# Patient Record
Sex: Male | Born: 1990 | Race: White | Hispanic: No | Marital: Single | Smoking: Current every day smoker
Health system: Southern US, Community
[De-identification: ages and names within clinical notes are randomized; demographics above are authoritative.]

---

## 2013-01-31 ENCOUNTER — Emergency Department: Payer: Self-pay | Admitting: Emergency Medicine

## 2014-07-18 ENCOUNTER — Emergency Department: Payer: Self-pay | Admitting: Internal Medicine

## 2014-09-14 ENCOUNTER — Emergency Department: Payer: Self-pay | Admitting: Emergency Medicine

## 2016-05-18 ENCOUNTER — Emergency Department
Admission: EM | Admit: 2016-05-18 | Discharge: 2016-05-18 | Disposition: A | Discharge status: Home / Self Care | Payer: Self-pay | Attending: Student | Admitting: Student

## 2016-05-18 DIAGNOSIS — K047 Periapical abscess without sinus: Secondary | ICD-10-CM | POA: Insufficient documentation

## 2016-05-18 MED ORDER — IBUPROFEN 800 MG PO TABS
800.0000 mg | ORAL_TABLET | Freq: Three times a day (TID) | ORAL | Status: DC | PRN
Start: 2016-05-18 — End: 2020-04-06

## 2016-05-18 MED ORDER — NAPROXEN 500 MG PO TABS: 500.0000 mg | ORAL_TABLET | Freq: Two times a day (BID) | ORAL | Status: DC

## 2016-05-18 MED ORDER — SULFAMETHOXAZOLE-TRIMETHOPRIM 800-160 MG PO TABS: 1.0000 | ORAL_TABLET | Freq: Two times a day (BID) | ORAL | Status: DC

## 2016-05-18 MED ORDER — OXYCODONE-ACETAMINOPHEN 5-325 MG PO TABS: 1.0000 | ORAL_TABLET | ORAL | Status: DC | PRN

## 2016-05-18 MED ORDER — LIDOCAINE VISCOUS 2 % MT SOLN: 20.0000 mL | OROMUCOSAL | Status: DC | PRN

## 2016-05-18 NOTE — Discharge Instructions (Signed)

## 2016-05-18 NOTE — ED Provider Notes (Signed)
[facility] Emergency Department Provider Note  ____________________________________________  Time seen: Approximately 2:23 PM  I have reviewed the triage vital signs and the nursing notes.   HISTORY  Chief Complaint Dental Pain    HPI Frank Graham is a 25 y.o. male presents for evaluation of right lower dental pain since yesterday. Patient reports waking up with some facial swelling this morning. Patient reports the pain is 10 over 10 and is not taking any medications at this time.   No past medical history on file.  There are no active problems to display for this patient.   No past surgical history on file.  Current Outpatient Rx  Name  Route  Sig  Dispense  Refill  . ibuprofen (ADVIL,MOTRIN) 800 MG tablet   Oral   Take 1 tablet (800 mg total) by mouth every 8 (eight) hours as needed.   30 tablet   0   . lidocaine (XYLOCAINE) 2 % solution   Mouth/Throat   Use as directed 20 mLs in the mouth or throat as needed for mouth pain.   100 mL   0   . naproxen (NAPROSYN) 500 MG tablet   Oral   Take 1 tablet (500 mg total) by mouth 2 (two) times daily with a meal.   60 tablet   0   . oxyCODONE-acetaminophen (ROXICET) 5-325 MG tablet   Oral   Take 1-2 tablets by mouth every 4 (four) hours as needed for severe pain.   15 tablet   0   . sulfamethoxazole-trimethoprim (BACTRIM DS,SEPTRA DS) 800-160 MG tablet   Oral   Take 1 tablet by mouth 2 (two) times daily.   20 tablet   0     Allergies Review of patient's allergies indicates not on file.  No family history on file.  Social History Social History  Substance Use Topics  . Smoking status: Not on file  . Smokeless tobacco: Not on file  . Alcohol Use: Not on file    Review of Systems Constitutional: No fever/chills Eyes: No visual changes. ENT: No sore throat .Right face obviously swollen and tender. Cardiovascular: Denies chest pain. Respiratory: Denies shortness of  breath. Gastrointestinal: No abdominal pain.  No nausea, no vomiting.  No diarrhea.  No constipation. Genitourinary: Negative for dysuria. Musculoskeletal: Negative for back pain. Skin: Negative for rash. Neurological: Negative for headaches, focal weakness or numbness.  10-point ROS otherwise negative.  ____________________________________________   PHYSICAL EXAM:  VITAL SIGNS: ED Triage Vitals  Enc Vitals Group     BP 05/18/16 1418 125/77 mmHg     Pulse Rate 05/18/16 1418 80     Resp 05/18/16 1418 16     Temp 05/18/16 1418 97.5 F (36.4 C)     Temp Source 05/18/16 1418 Oral     SpO2 05/18/16 1418 99 %     Weight 05/18/16 1418 145 lb (65.772 kg)     Height 05/18/16 1418 5\' 8"  (1.727 m)     Head Cir --      Peak Flow --      Pain Score --      Pain Loc --      Pain Edu? --      Excl. in GC? --     Constitutional: Alert and oriented. Well appearing and in no acute distress. Head: Atraumatic. Nose: No congestion/rhinnorhea. Mouth/Throat: Mucous membranes are moist.  Oropharynx non-erythematous. Obvious dental caries in the right lower side facial swelling noted. Neck: No stridor.  Mild right cervical adenopathy. Cardiovascular: Normal rate, regular rhythm. Grossly normal heart sounds.  Good peripheral circulation. Respiratory: Normal respiratory effort.  No retractions. Lungs CTAB. Musculoskeletal: No lower extremity tenderness nor edema.  No joint effusions. Neurologic:  Normal speech and language. No gross focal neurologic deficits are appreciated. No gait instability. Skin:  Skin is warm, dry and intact. No rash noted. Psychiatric: Mood and affect are normal. Speech and behavior are normal.  ____________________________________________   LABS (all labs ordered are listed, but only abnormal results are displayed)  Labs Reviewed - No data to display   PROCEDURES  Procedure(s) performed: None  Critical Care performed:  No  ____________________________________________   INITIAL IMPRESSION / ASSESSMENT AND PLAN / ED COURSE  Pertinent labs & imaging results that were available during my care of the patient were reviewed by me and considered in my medical decision making (see chart for details).  Acute dental abscess. Patient was started on prescription for Septra DS twice a day, Percocet 5/325, Naprosyn 500 mg, viscous lidocaine as needed. Patient follow-up with local dentist as soon as possible. ____________________________________________   FINAL CLINICAL IMPRESSION(S) / ED DIAGNOSES  Final diagnoses:  Abscess, dental     This chart was dictated using voice recognition software/Dragon. Despite best efforts to proofread, errors can occur which can change the meaning. Any change was purely unintentional.   Evangeline Dakinharles M Atley Neubert, PA-C 05/18/16 1513  Frank DossEryka A Gayle, MD 05/18/16 [phone]

## 2016-05-18 NOTE — ED Notes (Signed)
Pt c/o right lower tooth pain since yesterday and today woke with facial swelling.

## 2016-11-21 ENCOUNTER — Emergency Department: Payer: Self-pay

## 2016-11-21 ENCOUNTER — Encounter: Payer: Self-pay | Admitting: Emergency Medicine

## 2016-11-21 ENCOUNTER — Emergency Department
Admission: EM | Admit: 2016-11-21 | Discharge: 2016-11-21 | Disposition: A | Discharge status: Home / Self Care | Payer: Self-pay | Attending: Emergency Medicine | Admitting: Emergency Medicine

## 2016-11-21 DIAGNOSIS — S62639B Displaced fracture of distal phalanx of unspecified finger, initial encounter for open fracture: Secondary | ICD-10-CM

## 2016-11-21 DIAGNOSIS — W230XXA Caught, crushed, jammed, or pinched between moving objects, initial encounter: Secondary | ICD-10-CM | POA: Insufficient documentation

## 2016-11-21 DIAGNOSIS — Y929 Unspecified place or not applicable: Secondary | ICD-10-CM | POA: Insufficient documentation

## 2016-11-21 DIAGNOSIS — Y990 Civilian activity done for income or pay: Secondary | ICD-10-CM | POA: Insufficient documentation

## 2016-11-21 DIAGNOSIS — F17210 Nicotine dependence, cigarettes, uncomplicated: Secondary | ICD-10-CM | POA: Insufficient documentation

## 2016-11-21 DIAGNOSIS — S67190A Crushing injury of right index finger, initial encounter: Secondary | ICD-10-CM | POA: Insufficient documentation

## 2016-11-21 DIAGNOSIS — Z23 Encounter for immunization: Secondary | ICD-10-CM | POA: Insufficient documentation

## 2016-11-21 DIAGNOSIS — S6710XA Crushing injury of unspecified finger(s), initial encounter: Secondary | ICD-10-CM

## 2016-11-21 DIAGNOSIS — Y9389 Activity, other specified: Secondary | ICD-10-CM | POA: Insufficient documentation

## 2016-11-21 DIAGNOSIS — Z791 Long term (current) use of non-steroidal anti-inflammatories (NSAID): Secondary | ICD-10-CM | POA: Insufficient documentation

## 2016-11-21 DIAGNOSIS — S62632A Displaced fracture of distal phalanx of right middle finger, initial encounter for closed fracture: Secondary | ICD-10-CM | POA: Insufficient documentation

## 2016-11-21 MED ORDER — HYDROCODONE-ACETAMINOPHEN 5-325 MG PO TABS: 1.0000 | ORAL_TABLET | Freq: Three times a day (TID) | ORAL | 0 refills | Status: DC | PRN

## 2016-11-21 MED ORDER — HYDROCODONE-ACETAMINOPHEN 5-325 MG PO TABS
1.0000 | ORAL_TABLET | Freq: Once | ORAL | Status: AC
  Administered 2016-11-21: 1 via ORAL
  Filled 2016-11-21: qty 1

## 2016-11-21 MED ORDER — TETANUS-DIPHTH-ACELL PERTUSSIS 5-2.5-18.5 LF-MCG/0.5 IM SUSP
0.5000 mL | Freq: Once | INTRAMUSCULAR | Status: AC
  Administered 2016-11-21: 0.5 mL via INTRAMUSCULAR
  Filled 2016-11-21: qty 0.5

## 2016-11-21 MED ORDER — LIDOCAINE HCL (PF) 1 % IJ SOLN
5.0000 mL | Freq: Once | INTRAMUSCULAR | Status: AC
  Administered 2016-11-21: 5 mL
  Filled 2016-11-21: qty 5

## 2016-11-21 MED ORDER — SULFAMETHOXAZOLE-TRIMETHOPRIM 800-160 MG PO TABS: 1.0000 | ORAL_TABLET | Freq: Two times a day (BID) | ORAL | 0 refills | Status: DC

## 2016-11-21 MED ORDER — SULFAMETHOXAZOLE-TRIMETHOPRIM 800-160 MG PO TABS
1.0000 | ORAL_TABLET | Freq: Once | ORAL | Status: AC
  Administered 2016-11-21: 1 via ORAL
  Filled 2016-11-21: qty 1

## 2016-11-21 NOTE — ED Notes (Signed)
Provider requested patient to soak hand in sterile saline. Sterile saline brought to room - patient currently soaking hand.  Pt unsure of date of last tetanus shot.

## 2016-11-21 NOTE — ED Triage Notes (Addendum)
Patient presents to the ED with forefinger injury to right hand.  Patient states his finger got smashed between two 800lb logs.  Patient states, "The end of my finger exploded like a paintball."  Patient is also complaining of pain to his middle finger to his right hand.  Patient appears uncomfortable in triage.  Patient states he does not want to file worker's comp.

## 2016-11-21 NOTE — Discharge Instructions (Signed)
Keep the wounds clean, dry, and covered. Change the dressing daily. Wear the splint as needed for support and protection. Follow-up with Dr. Rosita KeaMenz for further fracture care.

## 2016-11-22 NOTE — ED Provider Notes (Signed)
[facility] Emergency Department Provider Note ____________________________________________  Time seen: 34  I have reviewed the triage vital signs and the nursing notes.  HISTORY  Chief Complaint  Finger Injury  HPI Frank Graham is a 26 y.o. male visits to the ED, for evaluation of injury to his index finger and middle fingers right hand. 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 He describes essentially a crush injury to his index finger. He has also some swelling to the tip of his middle finger, without any laceration. He notes blood under the nail of both fingers, and a laceration to the distal portion of his index finger. He is unclear of his current tetanus status.  History reviewed. No pertinent past medical history.  There are no active problems to display for this patient.  History reviewed. No pertinent surgical history.  Prior to Admission medications   Medication Sig Start Date End Date Taking? Authorizing Provider  HYDROcodone-acetaminophen (NORCO) 5-325 MG tablet Take 1 tablet by mouth 3 (three) times daily as needed. 11/21/16   Clinten Howk V Bacon Larson Limones, PA-C  ibuprofen (ADVIL,MOTRIN) 800 MG tablet Take 1 tablet (800 mg total) by mouth every 8 (eight) hours as needed. 05/18/16   Charmayne Sheer Beers, PA-C  lidocaine (XYLOCAINE) 2 % solution Use as directed 20 mLs in the mouth or throat as needed for mouth pain. 05/18/16   Evangeline Dakin, PA-C  naproxen (NAPROSYN) 500 MG tablet Take 1 tablet (500 mg total) by mouth 2 (two) times daily with a meal. 05/18/16   Evangeline Dakin, PA-C  oxyCODONE-acetaminophen (ROXICET) 5-325 MG tablet Take 1-2 tablets by mouth every 4 (four) hours as needed for severe pain. 05/18/16   Charmayne Sheer Beers, PA-C  sulfamethoxazole-trimethoprim (BACTRIM DS,SEPTRA DS) 800-160 MG tablet Take 1 tablet by mouth 2 (two) times daily. 11/21/16   Mikell Camp V Bacon Saniyya Gau, PA-C     Allergies Tramadol  No family history on file.  Social History Social History  Substance Use Topics  . Smoking status: Current Every Day Smoker    Packs/day: 0.75    Types: Cigarettes  . Smokeless tobacco: Never Used  . Alcohol use Yes     Comment: occasionally    Review of Systems  Constitutional: Negative for fever. Cardiovascular: Negative for chest pain. Respiratory: Negative for shortness of breath. Gastrointestinal: Negative for abdominal pain, vomiting and diarrhea. Musculoskeletal: Negative for back pain. Right index and middle finger pain as well. Skin: Negative for rash. Neurological: Negative for headaches, focal weakness or numbness. ____________________________________________  PHYSICAL EXAM:  VITAL SIGNS: ED Triage Vitals  Enc Vitals Group     BP 11/21/16 1834 (!) 146/81     Pulse Rate 11/21/16 1834 85     Resp 11/21/16 1834 18     Temp 11/21/16 1834 98.3 F (36.8 C)     Temp Source 11/21/16 1834 Oral     SpO2 11/21/16 1834 100 %     Weight 11/21/16 1835 150 lb (68 kg)     Height 11/21/16 1835 5\' 8"  (1.727 m)     Head Circumference --      Peak Flow --      Pain Score 11/21/16 1835 9     Pain Loc --      Pain Edu? --      Excl. in GC? --    Constitutional: Alert and oriented. Well appearing and in no  distress. Head: Normocephalic and atraumatic. Cardiovascular: Normal rate, regular rhythm. Normal distal pulses. Respiratory: Normal respiratory effort. No wheezes/rales/rhonchi. Musculoskeletal: Patient's right index finger with an obvious partial degloving mechanism of the skin of the distal phalanx. There is a moderate to full subungual hematoma injury to the otherwise intact nail, as the laceration extends from beyond the nail root. The middle finger shows a 20% subungual hematoma without an laceration or open fracture. Nontender with normal range of motion in all extremities.  Neurologic:  Normal gross sensation. Normal speech and language. No  gross focal neurologic deficits are appreciated. Skin:  Skin is warm, dry and intact. No rash noted. ____________________________________________   RADIOLOGY  Right Hand  IMPRESSION: Minor fracture of the distal tuft of the middle finger.  More extensive injury of the distal index finger. Pronounced soft tissue injury. Comminuted fracture of the distal phalanx as outlined above. No visible extension to the articular surface.  I, Chrystian Cupples, Charlesetta IvoryJenise V Bacon, personally viewed and evaluated these images (plain radiographs) as part of my medical decision making, as well as reviewing the written report by the radiologist. ____________________________________________  PROCEDURES  Norco 5-325 mg i PO Bactrim DS i PO Tdap 0.5 mg IM Dressing  NERVE BLOCK Performed by: Lissa HoardMenshew, Dontre Laduca V Bacon Consent: Verbal consent obtained. Required items: required blood products, implants, devices, and special equipment available Time out: Immediately prior to procedure a "time out" was called to verify the correct patient, procedure, equipment, support staff and site/side marked as required.  Indication: pain relief Nerve block body site: flexor transthecal tendon 2nd & 3rd digits  Preparation: Patient was prepped and draped in the usual sterile fashion. Needle gauge: 24 G Location technique: anatomical landmarks  Local anesthetic: 1% lido  Anesthetic total: 5 ml  Outcome: pain improved Patient tolerance: Patient tolerated the procedure well with no immediate complications.  SPLINT APPLICATION Date/Time: 19:50 Authorized by: Lissa HoardMenshew, Beatrice Sehgal V Bacon Consent: Verbal consent obtained. Risks and benefits: risks, benefits and alternatives were discussed Consent given by: patient Splint applied by: Dorris CarnesJ. Shenee Wignall, PA-C Location details: Right  Splint type: Fold-over Finger Splint Supplies used: ortho-glass Post-procedure: The splinted body part was neurovascularly unchanged following the  procedure. Patient tolerance: Patient tolerated the procedure well with no immediate complications. ____________________________________________  INITIAL IMPRESSION / ASSESSMENT AND PLAN / ED COURSE  Patient with a crush injury to the right index finger with an open distal tuft fracture, and a closed distal tuft fracture of the third digit. Patient is appropriately dressed and splinted and provided with prescriptions for Bactrim and hydrocodone. He will follow up with orthopedics in 2 days for wound check. Return precautions are reviewed. He may return to work with limited use of the right hand.  Clinical Course    ____________________________________________  FINAL CLINICAL IMPRESSION(S) / ED DIAGNOSES  Final diagnoses:  Open fracture of tuft of distal phalanx of finger  Crushing injury of finger, initial encounter      Lissa HoardJenise V Bacon Bryley Chrisman, PA-C 11/22/16 0128    Lissa HoardJenise V Bacon Estefanny Moler, PA-C 11/22/16 0128    Phineas SemenGraydon Goodman, MD 11/22/16 1609

## 2017-08-18 IMAGING — CR DG HAND COMPLETE 3+V*R*
3 series · 3 of 3 positions shown · non-contrast
Comparison: None.

CLINICAL DATA: Crush injury between 2 logs.

EXAM:
RIGHT HAND - COMPLETE 3+ VIEW

[hand ap]
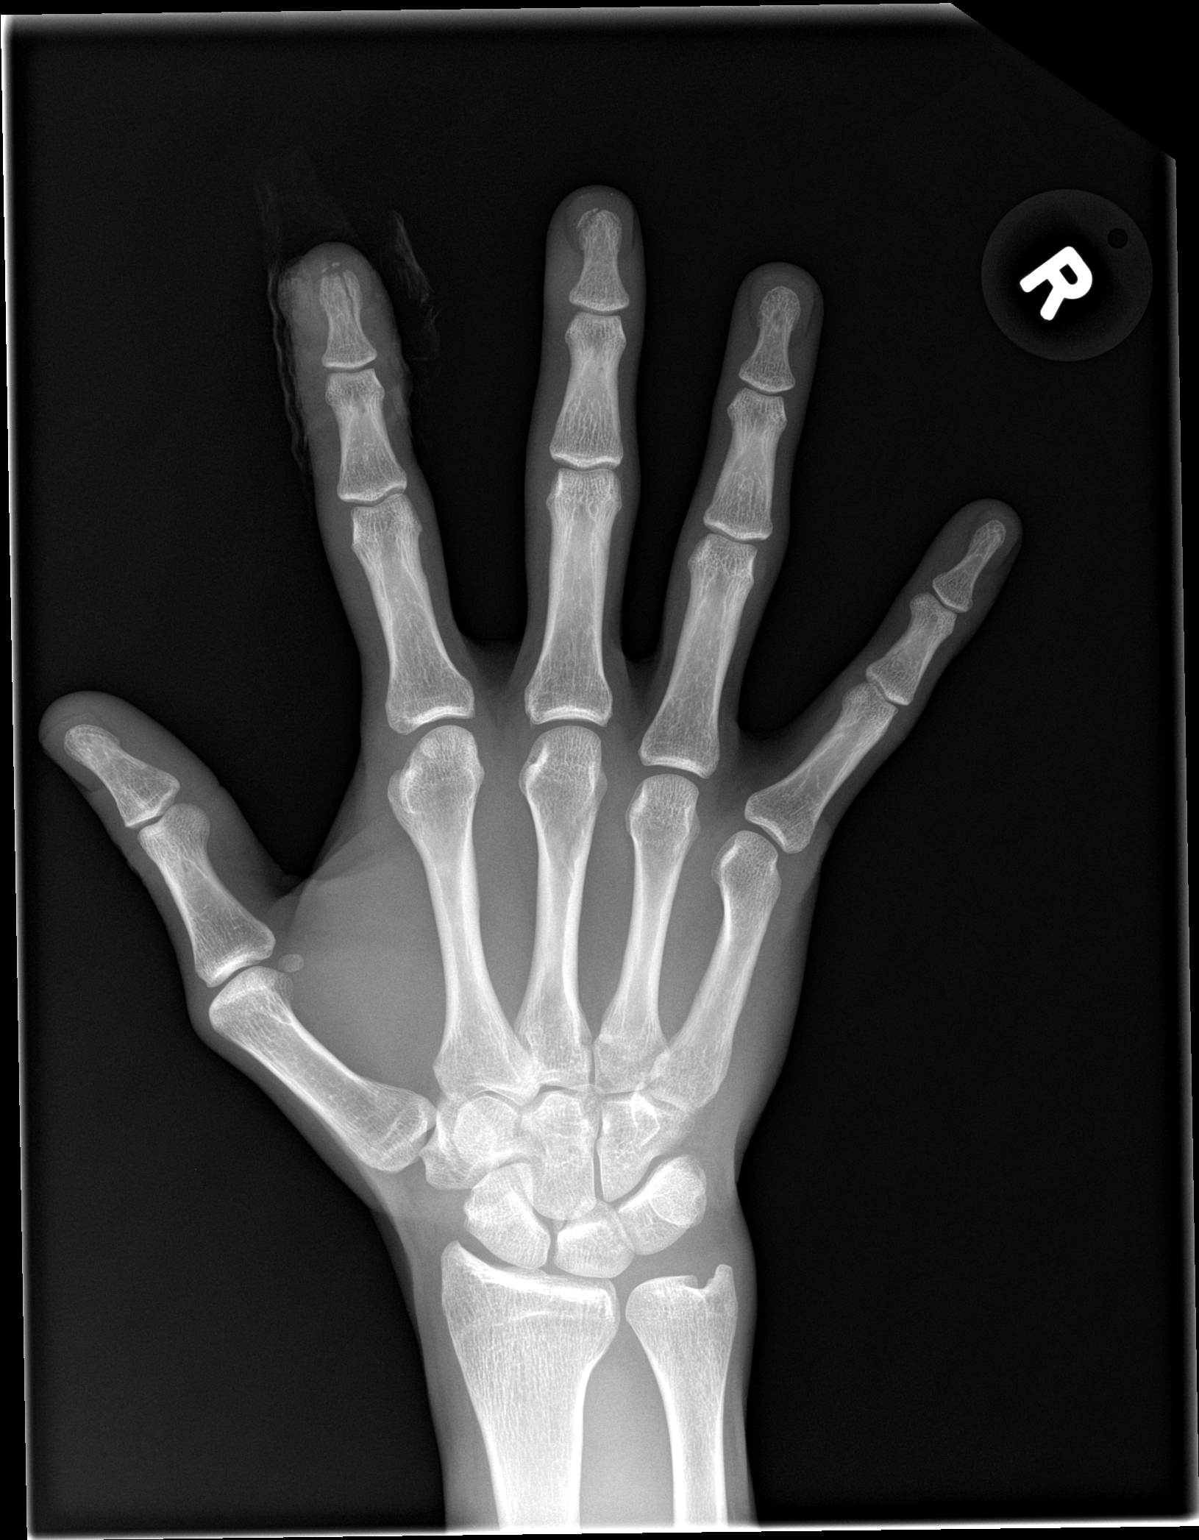

[hand obl]
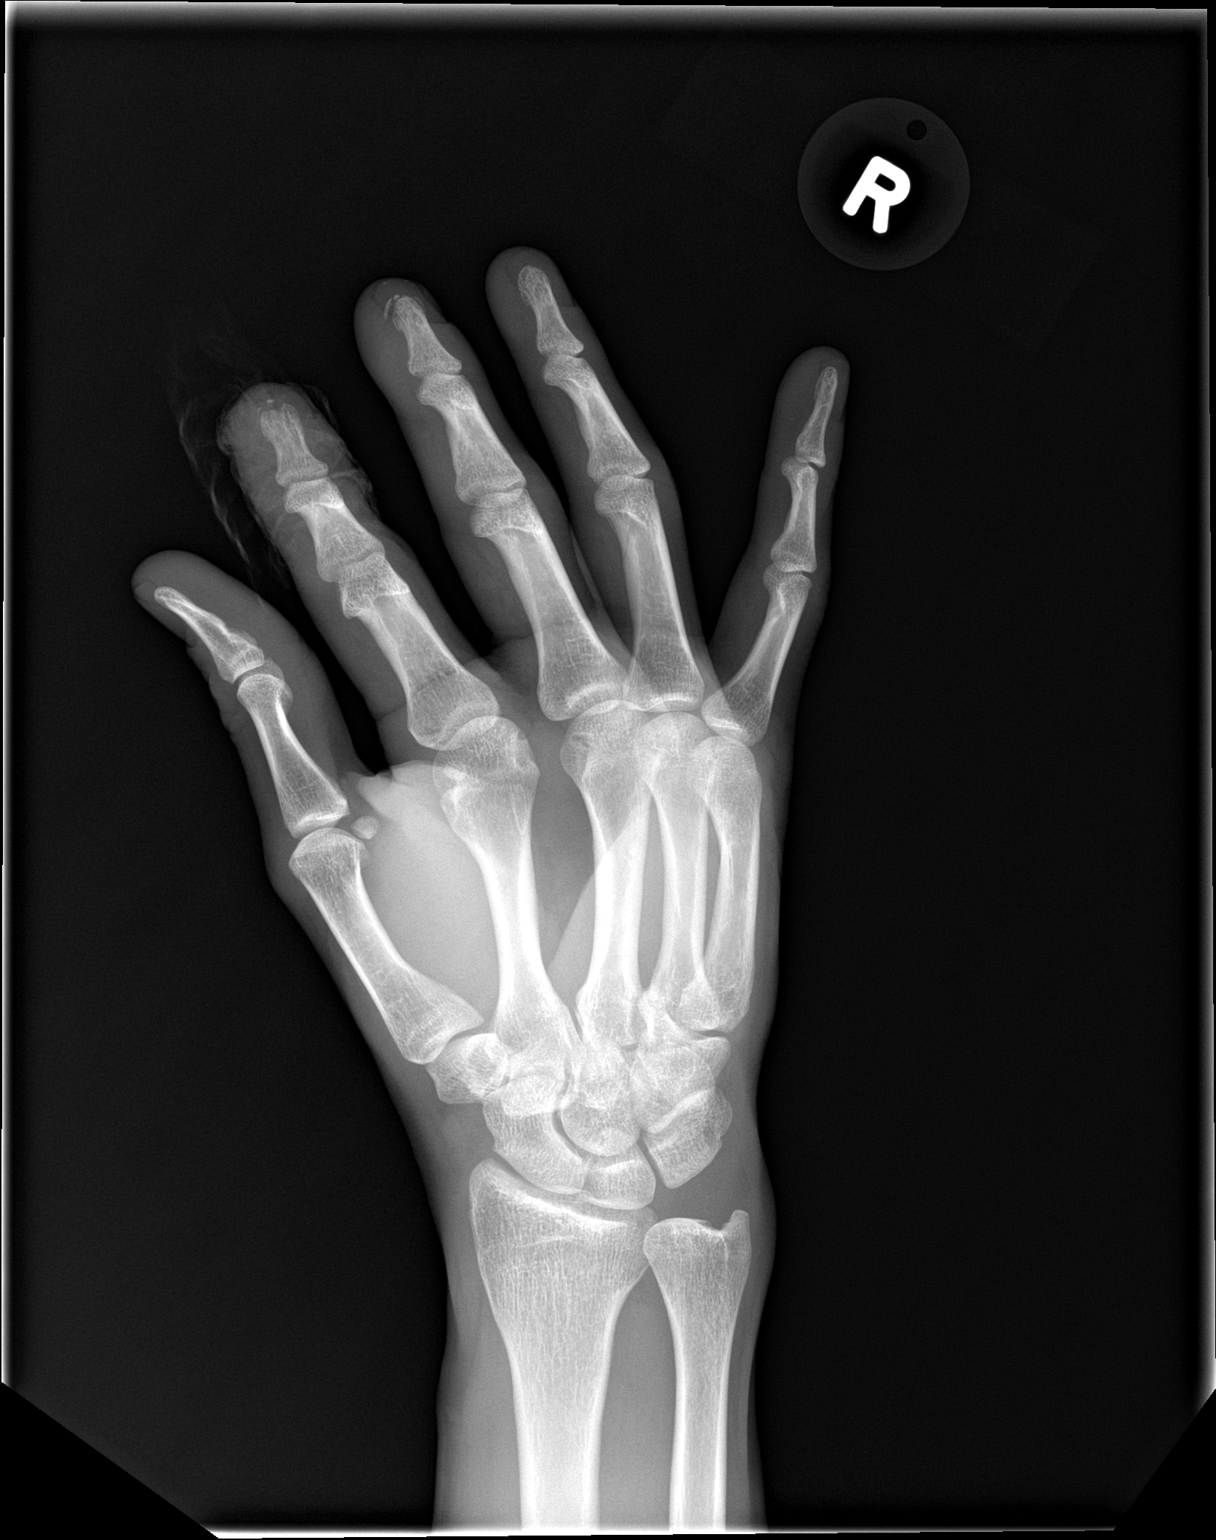

[hand lat]
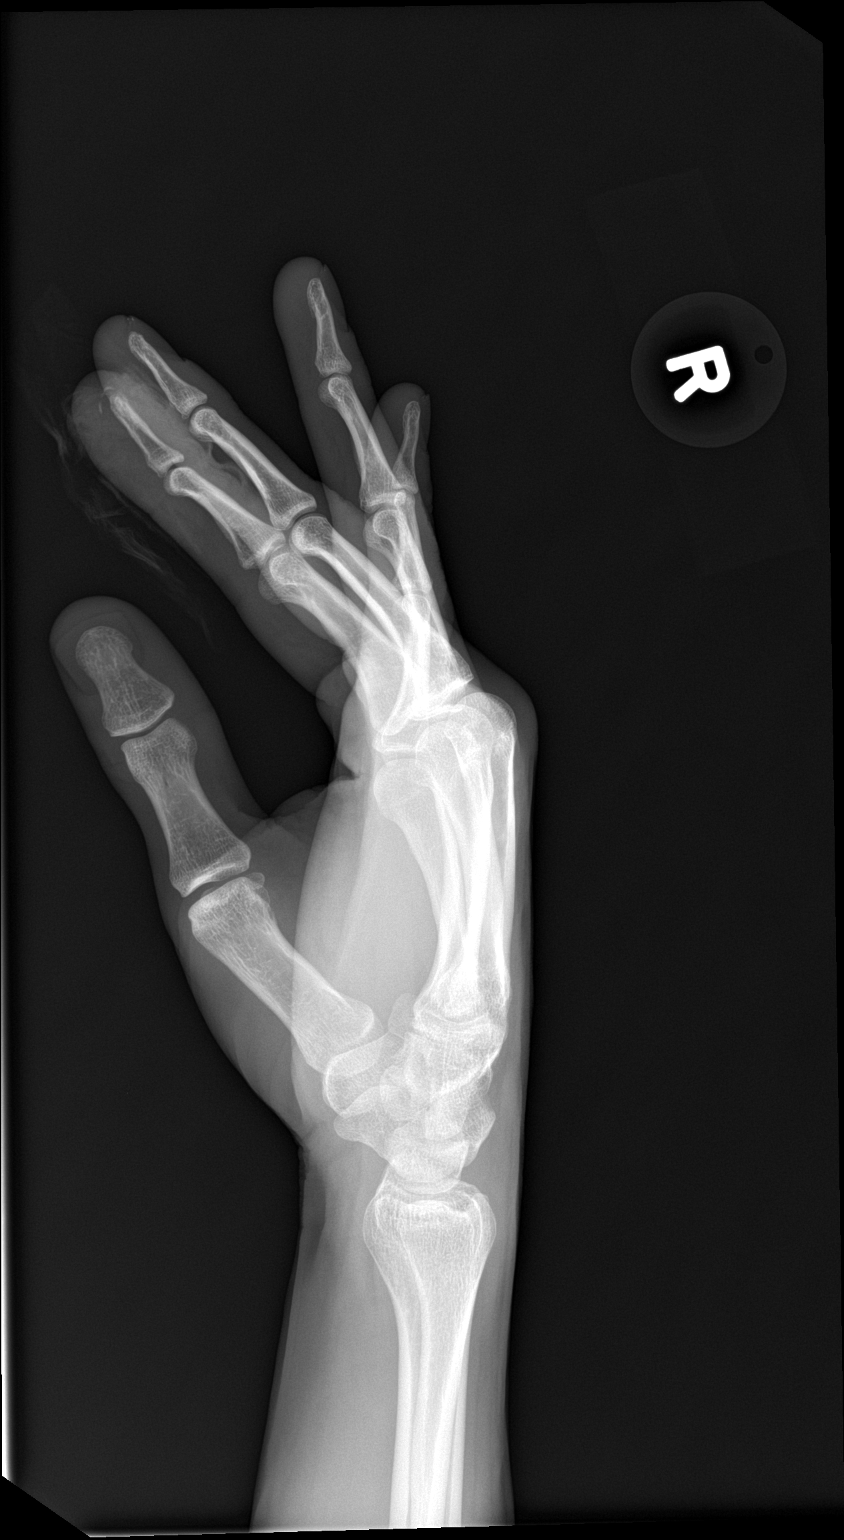

[3 of 3 positions shown; findings below may reference images not displayed]

FINDINGS: Soft tissue deformity of the distal index finger. Fracture of the
distal phalanx with comminution of the distal tuft and a sagittal
fracture line extending towards the proximal bone but not apparently
to the proximal articular surface. Fracture of the distal tuft of
the middle finger. Other bones appear normal.
IMPRESSION: Minor fracture of the distal tuft of the middle finger.

More extensive injury of the distal index finger. Pronounced soft
tissue injury. Comminuted fracture of the distal phalanx as outlined
above. No visible extension to the articular surface.

## 2020-04-06 ENCOUNTER — Other Ambulatory Visit: Payer: Self-pay

## 2020-04-06 ENCOUNTER — Emergency Department
Admission: EM | Admit: 2020-04-06 | Discharge: 2020-04-06 | Disposition: A | Discharge status: Home / Self Care | Payer: Self-pay | Attending: Student | Admitting: Student

## 2020-04-06 DIAGNOSIS — Z7289 Other problems related to lifestyle: Secondary | ICD-10-CM | POA: Insufficient documentation

## 2020-04-06 DIAGNOSIS — R45851 Suicidal ideations: Secondary | ICD-10-CM | POA: Insufficient documentation

## 2020-04-06 DIAGNOSIS — Z23 Encounter for immunization: Secondary | ICD-10-CM | POA: Insufficient documentation

## 2020-04-06 DIAGNOSIS — R4589 Other symptoms and signs involving emotional state: Secondary | ICD-10-CM

## 2020-04-06 DIAGNOSIS — F439 Reaction to severe stress, unspecified: Secondary | ICD-10-CM | POA: Insufficient documentation

## 2020-04-06 DIAGNOSIS — Z046 Encounter for general psychiatric examination, requested by authority: Secondary | ICD-10-CM | POA: Insufficient documentation

## 2020-04-06 DIAGNOSIS — Z79899 Other long term (current) drug therapy: Secondary | ICD-10-CM | POA: Insufficient documentation

## 2020-04-06 DIAGNOSIS — F4323 Adjustment disorder with mixed anxiety and depressed mood: Secondary | ICD-10-CM | POA: Insufficient documentation

## 2020-04-06 DIAGNOSIS — F17210 Nicotine dependence, cigarettes, uncomplicated: Secondary | ICD-10-CM | POA: Insufficient documentation

## 2020-04-06 LAB — CBC
HCT: 40.5 % (ref 39.0–52.0)
Hemoglobin: 14.4 g/dL (ref 13.0–17.0)
MCH: 33.6 pg (ref 26.0–34.0)
MCHC: 35.6 g/dL (ref 30.0–36.0)
MCV: 94.4 fL (ref 80.0–100.0)
Platelets: 189 10*3/uL (ref 150–400)
RBC: 4.29 MIL/uL (ref 4.22–5.81)
RDW: 12.8 % (ref 11.5–15.5)
WBC: 8.5 10*3/uL (ref 4.0–10.5)
nRBC: 0 % (ref 0.0–0.2)

## 2020-04-06 LAB — COMPREHENSIVE METABOLIC PANEL
ALT: 19 U/L (ref 0–44)
AST: 30 U/L (ref 15–41)
Albumin: 5.1 g/dL — ABNORMAL HIGH (ref 3.5–5.0)
Alkaline Phosphatase: 66 U/L (ref 38–126)
Anion gap: 13 (ref 5–15)
BUN: 10 mg/dL (ref 6–20)
CO2: 27 mmol/L (ref 22–32)
Calcium: 9.5 mg/dL (ref 8.9–10.3)
Chloride: 102 mmol/L (ref 98–111)
Creatinine, Ser: 0.81 mg/dL (ref 0.61–1.24)
GFR calc Af Amer: >60 mL/min (ref >60)
GFR calc non Af Amer: >60 mL/min (ref >60)
Glucose, Bld: 105 mg/dL — ABNORMAL HIGH (ref 70–99)
Potassium: 3.2 mmol/L — ABNORMAL LOW (ref 3.5–5.1)
Sodium: 142 mmol/L (ref 135–145)
Total Bilirubin: 0.5 mg/dL (ref 0.3–1.2)
Total Protein: 7.8 g/dL (ref 6.5–8.1)

## 2020-04-06 LAB — ACETAMINOPHEN LEVEL: Acetaminophen (Tylenol), Serum: <10 ug/mL — ABNORMAL LOW (ref 10–30)

## 2020-04-06 LAB — SALICYLATE LEVEL: Salicylate Lvl: <7 mg/dL — ABNORMAL LOW (ref 7.0–30.0)

## 2020-04-06 LAB — ETHANOL: Alcohol, Ethyl (B): 99 mg/dL — ABNORMAL HIGH (ref <10)

## 2020-04-06 MED ORDER — TETANUS-DIPHTH-ACELL PERTUSSIS 5-2.5-18.5 LF-MCG/0.5 IM SUSP
0.5000 mL | Freq: Once | INTRAMUSCULAR | Status: AC
  Administered 2020-04-06: 0.5 mL via INTRAMUSCULAR
  Filled 2020-04-06: qty 0.5

## 2020-04-06 NOTE — ED Notes (Signed)
Nurse called report to Nicole Cella RN in the Media, order to move to [facility] unit.

## 2020-04-06 NOTE — ED Notes (Addendum)
Pt discharging to home. Discharge teaching done. Pt signed paper discharge form. Pt given all his personal belongings. Escorted out to lobby by NT, ambulatory and in NAD.

## 2020-04-06 NOTE — ED Notes (Signed)
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 States that His Dad has not talked to him much in the last few years, and was wants that relationship to improve, He has insight into issues and Nurse listened, showed empathy, and talked to him about programs to help with sobriety and about confidence, impulsiveness, and self Love.

## 2020-04-06 NOTE — ED Notes (Addendum)
Patient changed into hospital provided scrubs by this RN and Beth NT. Patient's belonging's placed into labeled bag.   Patient's belonging's include: 1 pair brown boots, 1 pair black socks, black boxers, green shorts, grey sweat pants, burgundy t-shirt, grey sweat shirt. Red and black bracelet, grey metal ring, yellow metal earring with white stone.

## 2020-04-06 NOTE — ED Triage Notes (Addendum)
Patient brought in under IVC by [employer] for self harm and SI. Patient reports separating from spouse and that his grandmother's health is deteriorating as stressors.   Patient has multiple superficial cuts to left arm.

## 2020-04-06 NOTE — ED Notes (Signed)
IVC PAPERS  RESCINDED

## 2020-04-06 NOTE — ED Notes (Signed)
Pt denies SI/HI/AVH on assessment. He reports he felt overwhelmed and made a horrible decision by cutting himself. He reports having trouble with his fiance with whom he has two children with. Superficial cuts to L arm and R side of abdomen. No bleeding or drainage noted to areas.Denies pain at this time.

## 2020-04-06 NOTE — ED Provider Notes (Signed)
[facility] Emergency Department Provider Note  ____________________________________________   First MD Initiated Contact with Patient 04/06/20 [phone]     (approximate)  I have reviewed the triage vital signs and the nursing notes.  History  Chief Complaint Suicidal    HPI Frank Graham is a 29 y.o. male who presents to the ER under IVC by PD for self harm and SI. Patient recently separated from his fiance and states his grandmother's health is deteriorating, which has caused significant stress.  States he used to handle his stress by drinking, but has recently started to decrease his drinking for his children, so he doesn't feel like he is able to handle stress well. As a result he cut his abdomen and his left volar forearm with a knife. Wounds are very superficial. States "it was stupid." Unknown las tetanus shot. He denies any HI, AVH. He does want help handling his stress and is open to getting psychiatric care. He is not on any psychiatric medications.    Past Medical Hx History reviewed. No pertinent past medical history.  Problem List There are no problems to display for this patient.   Past Surgical Hx History reviewed. No pertinent surgical history.  Medications Prior to Admission medications   Medication Sig Start Date End Date Taking? Authorizing Provider  HYDROcodone-acetaminophen (NORCO) 5-325 MG tablet Take 1 tablet by mouth 3 (three) times daily as needed. 11/21/16   Menshew, Charlesetta Ivory, PA-C  ibuprofen (ADVIL,MOTRIN) 800 MG tablet Take 1 tablet (800 mg total) by mouth every 8 (eight) hours as needed. 05/18/16   Beers, Charmayne Sheer, PA-C  lidocaine (XYLOCAINE) 2 % solution Use as directed 20 mLs in the mouth or throat as needed for mouth pain. 05/18/16   Beers, Charmayne Sheer, PA-C  naproxen (NAPROSYN) 500 MG tablet Take 1 tablet (500 mg total) by mouth 2 (two) times daily with a meal. 05/18/16   Beers, Charmayne Sheer, PA-C  oxyCODONE-acetaminophen  (ROXICET) 5-325 MG tablet Take 1-2 tablets by mouth every 4 (four) hours as needed for severe pain. 05/18/16   Beers, Charmayne Sheer, PA-C  sulfamethoxazole-trimethoprim (BACTRIM DS,SEPTRA DS) 800-160 MG tablet Take 1 tablet by mouth 2 (two) times daily. 11/21/16   Menshew, Charlesetta Ivory, PA-C    Allergies Tramadol  Family Hx No family history on file.  Social Hx Social History   Tobacco Use  . Smoking status: Current Every Day Smoker    Packs/day: 0.75    Types: Cigarettes  . Smokeless tobacco: Never Used  Substance Use Topics  . Alcohol use: Yes    Comment: occasionally  . Drug use: Not on file     Review of Systems  Constitutional: Negative for fever, chills. Eyes: Negative for visual changes. ENT: Negative for sore throat. Cardiovascular: Negative for chest pain. Respiratory: Negative for shortness of breath. Gastrointestinal: Negative for nausea, vomiting.  Genitourinary: Negative for dysuria. Musculoskeletal: Negative for leg swelling. Skin: + multiple superficial scratches to left forearm and abdomen  Neurological: Negative for for headaches.   Physical Exam  Vital Signs: ED Triage Vitals  Enc Vitals Group     BP 04/06/20 0606 (!) 122/97     Pulse Rate 04/06/20 0606 90     Resp 04/06/20 0606 18     Temp 04/06/20 0606 97.8 F (36.6 C)     Temp src --      SpO2 04/06/20 0606 97 %     Weight 04/06/20 0605 149 lb 14.6 oz (68 kg)  Height 04/06/20 0605 5\' 8"  (1.727 m)     Head Circumference --      Peak Flow --      Pain Score 04/06/20 0605 3     Pain Loc --      Pain Edu? --      Excl. in [place]? --     Constitutional: Alert and oriented.  Head: Normocephalic. Atraumatic. Eyes: Conjunctivae clear. Sclera anicteric. Nose: No congestion. No rhinorrhea. Mouth/Throat: Mucous membranes are moist.  Neck: No stridor.   Cardiovascular: Normal rate. Extremities well perfused. Respiratory: Normal respiratory effort.  No hypoxia. No respiratory  distress. Gastrointestinal: Non-distended.  Multiple superficial self-inflicted scratches to the anterior abdominal wall.  No bleeding.  Do not require suture repair. Musculoskeletal: No deformities. Neurologic:  Normal speech and language. No gross focal neurologic deficits are appreciated.  Skin: Multiple, linear, superficial self-inflicted scratches to the abdominal wall in the left volar forearm.  These are hemostatic and do not require suture repair. Psychiatric: Calm and cooperative. Denies HI, AVH. Positive for multiple life stressors.   EKG  N/A    Radiology  N/A   Procedures  Procedure(s) performed (including critical care):  Procedures   Initial Impression / Assessment and Plan / ED Course  29 y.o. male who presents to the ED who presents to the ER under IVC by PD for self harm and SI in the setting of multiple life stressors. Exam as above, he has multiple, linear, superficial self-inflicted scratches to the abdominal wall in the left volar forearm.  These are hemostatic and do not require suture repair. Will update tetanus.   Will obtain basic screening labs and consult psychiatry.   The patient has been placed in psychiatric observation due to the need to provide a safe environment for the patient while obtaining psychiatric consultation and evaluation, as well as ongoing medical and medication management to treat the patient's condition.  The patient has been placed under full IVC at this time by PD prior to arrival.  Patient voices understanding of the plan and is in agreement.     Final Clinical Impression(s) / ED Diagnosis  Final diagnoses:  Thoughts of self harm  Stress       Note:  This document was prepared using Dragon voice recognition software and may include unintentional dictation errors.   Lilia Pro., MD 04/06/20 [phone]

## 2020-04-06 NOTE — ED Notes (Signed)
IVC pending consult moved to [facility] 

## 2020-04-06 NOTE — ED Notes (Signed)
Gave food tray with juice. Gave clothes to change, and is being discharged. Vitals have been put in.

## 2020-04-06 NOTE — Discharge Instructions (Addendum)
You are cleared by the psychiatric team for discharge home.  

## 2020-04-06 NOTE — Evaluation (Signed)
Rama Anne Fu MD  Psychiatry ER Eval  [place] [facility]  04/06/2020   CC--I am worried about family    HPI ---Caucasian male, 29 year old with adjustment disorder with mixed emotions, conduct depression and anxiety, bereavement, post left wrist scrapes, cuts after being upset upon learning news of Grandma having dementia and having to possibly go to Nursing home.  He was triggered by his memories of deceased mother at age 68 and felt sentimental.  Had stopped drinking two or three months ago --on and off since age 68 and said he had no coping skills or social skills to cope with stress last pm.   Was discussing feelings with his fiancee and felt overwhelmed.  No previous psych  History, medications or treatment  Was placed on IVC but now wants to go home.   He contracts for safety without active SI HI or plans.  He feels this was a one time event and was not influenced by ETOH or drugs.  He has a less than one year old and 31 year old and has to go home to care for them with fiance.  He has to start a landscaping job in am   He is willing to do family therapy and or go to outpatient management for this.   He has bereavement and some elements of depression, anxiety emotion and conduct issues but no frank major depression, psychosis, mania, panic, bipolar symptoms now.  No active substance or ETOH as mentioned above   Fiance willing to pick him up and take him home   His IVC was released by me before departure.  His wound looks cleaned and is healing --he does not have other limbs or extremities involved at this time   Past history   Allergies NDKA  Surgeries None  Medical problems None   Educational --finished 11th grade GED pending   Social History ---as above --lives with fiance  And two kids   Dad estranged from him    Mom passed away at early age  Family history --he says he has no psych family history   Court and legal issues none pending he says   Labs  None significant  now    Mental Status  Alert cooperative oriented to person place and time   Appearance normal   Rapport normal  Wound is healing without need for stitches and all   Mood somewhat anxious not depressed  Affect normal   Consciousness not clouded or fluctuant --normal makes eye contract appropriately   Thought process and content --no frank psychosis, preoccupied with financial concerns and his grandmom and memories of his mom at age 78  Years  No frank PTSD however   Memory --remote recent and immediate intact through general memory questions   Fund of Knowledge ---normal   Judgement insight reliability --fair possibly poor ----needs coping and social skills to help with his issues  SI and HI --contracts for safety, has a job / needs to go home to take care of family --- Has no active SI HI or plans, feels he did an impulsive one time thing   Seeks to go home   Abstraction proverbs normal   Concentration and attention normal    Diagnosis    Adjustment Disorder with mixed emotions, conduct, depression and anxiety   History of ETOH dependence   Bereavement    A/P    At this time his IVC was released as he contracts to go home to seek outpatient  care and med management, possible AA NA related programming coping and social skills groups and family therapy .
